# Patient Record
Sex: Male | Born: 1960 | Race: Black or African American | Hispanic: No | Marital: Single | Smoking: Former smoker
Health system: Southern US, Community
[De-identification: ages and names within clinical notes are randomized; demographics above are authoritative.]

## PROBLEM LIST (undated history)

## (undated) DIAGNOSIS — M719 Bursopathy, unspecified: Secondary | ICD-10-CM

## (undated) HISTORY — PX: CYST EXCISION: SHX5701

## (undated) HISTORY — PX: COLONOSCOPY: SHX174

---

## 2012-04-13 ENCOUNTER — Encounter (HOSPITAL_COMMUNITY): Payer: Self-pay | Admitting: *Deleted

## 2012-04-13 ENCOUNTER — Emergency Department (HOSPITAL_COMMUNITY)
Admission: EM | Admit: 2012-04-13 | Discharge: 2012-04-13 | Disposition: A | Discharge status: Home / Self Care | Payer: Self-pay | Attending: Emergency Medicine | Admitting: Emergency Medicine

## 2012-04-13 DIAGNOSIS — M778 Other enthesopathies, not elsewhere classified: Secondary | ICD-10-CM

## 2012-04-13 DIAGNOSIS — M2540 Effusion, unspecified joint: Secondary | ICD-10-CM | POA: Insufficient documentation

## 2012-04-13 DIAGNOSIS — Z8739 Personal history of other diseases of the musculoskeletal system and connective tissue: Secondary | ICD-10-CM | POA: Insufficient documentation

## 2012-04-13 DIAGNOSIS — M659 Unspecified synovitis and tenosynovitis, unspecified site: Secondary | ICD-10-CM | POA: Insufficient documentation

## 2012-04-13 DIAGNOSIS — Z791 Long term (current) use of non-steroidal anti-inflammatories (NSAID): Secondary | ICD-10-CM | POA: Insufficient documentation

## 2012-04-13 DIAGNOSIS — M1990 Unspecified osteoarthritis, unspecified site: Secondary | ICD-10-CM | POA: Insufficient documentation

## 2012-04-13 HISTORY — DX: Bursopathy, unspecified: M71.9

## 2012-04-13 MED ORDER — PROMETHAZINE HCL 12.5 MG PO TABS
12.5000 mg | ORAL_TABLET | Freq: Once | ORAL | Status: AC
  Administered 2012-04-13: 12.5 mg via ORAL
  Filled 2012-04-13: qty 1

## 2012-04-13 MED ORDER — KETOROLAC TROMETHAMINE 10 MG PO TABS
10.0000 mg | ORAL_TABLET | Freq: Once | ORAL | Status: AC
  Administered 2012-04-13: 10 mg via ORAL
  Filled 2012-04-13: qty 1

## 2012-04-13 MED ORDER — PREDNISONE 50 MG PO TABS
60.0000 mg | ORAL_TABLET | Freq: Once | ORAL | Status: AC
  Administered 2012-04-13: 60 mg via ORAL
  Filled 2012-04-13: qty 1

## 2012-04-13 MED ORDER — HYDROCODONE-ACETAMINOPHEN 5-325 MG PO TABS
2.0000 | ORAL_TABLET | Freq: Once | ORAL | Status: AC
  Administered 2012-04-13: 2 via ORAL
  Filled 2012-04-13: qty 2

## 2012-04-13 MED ORDER — PREDNISONE 10 MG PO TABS: ORAL_TABLET | ORAL | Status: DC

## 2012-04-13 MED ORDER — MELOXICAM 7.5 MG PO TABS: ORAL_TABLET | ORAL | Status: DC

## 2012-04-13 MED ORDER — HYDROCODONE-ACETAMINOPHEN 5-325 MG PO TABS: ORAL_TABLET | ORAL | Status: DC

## 2012-04-13 NOTE — ED Notes (Signed)
Pain rt wrist for 1 week, No recent injury,  Says he has flare ups of pain in different joints and say it is bursitis

## 2012-04-13 NOTE — ED Provider Notes (Signed)
History     CSN: [id number]  Arrival date & time 04/13/12  1435   First MD Initiated Contact with Patient 04/13/12 1522      Chief Complaint  Patient presents with  . Wrist Pain    (Consider location/radiation/quality/duration/timing/severity/associated sxs/prior treatment) Patient is a 52 y.o. male presenting with wrist pain. The history is provided by the patient.  Wrist Pain This is a new problem. The current episode started in the past 7 days. The problem occurs daily. The problem has been gradually worsening. Associated symptoms include arthralgias and joint swelling. Pertinent negatives include no abdominal pain, chest pain, coughing or neck pain. Exacerbated by: movement and palpation. He has tried nothing for the symptoms. The treatment provided no relief.    Past Medical History  Diagnosis Date  . Bursitis     History reviewed. No pertinent past surgical history.  Family History  Problem Relation Age of Onset  . Hypertension Mother   . Hypertension Father     History  Substance Use Topics  . Smoking status: Never Smoker   . Smokeless tobacco: Not on file  . Alcohol Use: Yes      Review of Systems  Constitutional: Negative for activity change.       All ROS Neg except as noted in HPI  HENT: Negative for nosebleeds and neck pain.   Eyes: Negative for photophobia and discharge.  Respiratory: Negative for cough, shortness of breath and wheezing.   Cardiovascular: Negative for chest pain and palpitations.  Gastrointestinal: Negative for abdominal pain and blood in stool.  Genitourinary: Negative for dysuria, frequency and hematuria.  Musculoskeletal: Positive for joint swelling and arthralgias. Negative for back pain.  Skin: Negative.   Neurological: Negative for dizziness, seizures and speech difficulty.  Psychiatric/Behavioral: Negative for hallucinations and confusion.    Allergies  Penicillins  Home Medications   Current Outpatient Rx  Name  Route   Sig  Dispense  Refill  . NAPROXEN SODIUM 220 MG PO TABS   Oral   Take 220 mg by mouth 2 (two) times daily with a meal.         . HYDROCODONE-ACETAMINOPHEN 5-325 MG PO TABS      1 or 2 po q4h prn pain   20 tablet   0   . MELOXICAM 7.5 MG PO TABS      1 po bid with food   12 tablet   0   . PREDNISONE 10 MG PO TABS      6,5,4,3,2,1 - take with food   21 tablet   0     BP 158/96  Pulse 89  Temp 97.9 F (36.6 C) (Oral)  Resp 20  Ht 6' (1.829 m)  Wt 215 lb (97.523 kg)  BMI 29.16 kg/m2  SpO2 100%  Physical Exam  Nursing note and vitals reviewed. Constitutional: He is oriented to person, place, and time. He appears well-developed and well-nourished.  Non-toxic appearance.  HENT:  Head: Normocephalic.  Right Ear: Tympanic membrane and external ear normal.  Left Ear: Tympanic membrane and external ear normal.  Eyes: EOM and lids are normal. Pupils are equal, round, and reactive to light.  Neck: Normal range of motion. Neck supple. Carotid bruit is not present.  Cardiovascular: Normal rate, regular rhythm, normal heart sounds, intact distal pulses and normal pulses.   Pulmonary/Chest: Breath sounds normal. No respiratory distress.  Abdominal: Soft. Bowel sounds are normal. There is no tenderness. There is no guarding.  Musculoskeletal: Normal range of  motion.       Right hand and wrist swollen. There is pain with flexion and a extension of the wrist and fingers. There is good range of motion of the right elbow and shoulder. No effusion noted. No hot joints appreciated. There degenerative changes noted of the hands.  Lymphadenopathy:       Head (right side): No submandibular adenopathy present.       Head (left side): No submandibular adenopathy present.    He has no cervical adenopathy.  Neurological: He is alert and oriented to person, place, and time. He has normal strength. No cranial nerve deficit or sensory deficit.  Skin: Skin is warm and dry.  Psychiatric: He has  a normal mood and affect. His speech is normal.    ED Course  Procedures (including critical care time)  Labs Reviewed - No data to display No results found.   1. Tendonitis of wrist, right   2. DJD (degenerative joint disease)       MDM  I have reviewed nursing notes, vital signs, and all appropriate lab and imaging results for this patient. Patient has a history of bursitis and arthritis. He works a job that involves a lot of of upper extremity movement in particular the wrist. His examination today is consistent with tendinitis and degenerative joint disease. Prescription for Mobic 7.5, prednisone taper, and Norco given to the patient. The patient is to see Dr. Romeo Apple for additional evaluation if not improving       Kathie Dike, [place] 04/13/12 1657

## 2012-04-14 NOTE — ED Provider Notes (Signed)
Medical screening examination/treatment/procedure(s) were performed by non-physician practitioner and as supervising physician I was immediately available for consultation/collaboration. Devoria Albe, MD, Armando Gang   Ward Givens, MD 04/14/12 [phone]

## 2012-09-20 ENCOUNTER — Encounter (HOSPITAL_COMMUNITY): Payer: Self-pay | Admitting: Emergency Medicine

## 2012-09-20 ENCOUNTER — Emergency Department (HOSPITAL_COMMUNITY)
Admission: EM | Admit: 2012-09-20 | Discharge: 2012-09-20 | Disposition: A | Discharge status: Home / Self Care | Payer: Self-pay | Attending: Emergency Medicine | Admitting: Emergency Medicine

## 2012-09-20 DIAGNOSIS — Z880 Allergy status to penicillin: Secondary | ICD-10-CM | POA: Insufficient documentation

## 2012-09-20 DIAGNOSIS — M5432 Sciatica, left side: Secondary | ICD-10-CM

## 2012-09-20 DIAGNOSIS — M5430 Sciatica, unspecified side: Secondary | ICD-10-CM | POA: Insufficient documentation

## 2012-09-20 DIAGNOSIS — Z8739 Personal history of other diseases of the musculoskeletal system and connective tissue: Secondary | ICD-10-CM | POA: Insufficient documentation

## 2012-09-20 MED ORDER — HYDROCODONE-ACETAMINOPHEN 5-325 MG PO TABS: 1.0000 | ORAL_TABLET | ORAL | Status: DC | PRN

## 2012-09-20 MED ORDER — CYCLOBENZAPRINE HCL 10 MG PO TABS: 10.0000 mg | ORAL_TABLET | Freq: Two times a day (BID) | ORAL | Status: DC | PRN

## 2012-09-20 NOTE — ED Notes (Signed)
Pt c/o left hip pain since Saturday. Denies injury. H/s bursitis-states it usually feels better with rest but pain is not going away this time. nad noted.

## 2012-09-20 NOTE — ED Notes (Signed)
Pt advised to have Bp evaluated.

## 2012-09-20 NOTE — ED Provider Notes (Signed)
Medical screening examination/treatment/procedure(s) were performed by non-physician practitioner and as supervising physician I was immediately available for consultation/collaboration.  Jesiah Yerby R. Jaequan Propes, MD 09/20/12 2324 

## 2012-09-20 NOTE — ED Notes (Signed)
Lt hip pain since Saturday, no injury, Says he has hx of bursitiis  And thinks that is causing the pain in his hip.

## 2012-09-20 NOTE — ED Provider Notes (Signed)
History     CSN: [id number]  Arrival date & time 09/20/12  1358   First MD Initiated Contact with Patient 09/20/12 1603      Chief Complaint  Patient presents with  . Hip Pain    (Consider location/radiation/quality/duration/timing/severity/associated sxs/prior treatment) Patient is a 52 y.o. male presenting with hip pain. The history is provided by the patient.  Hip Pain This is a new problem. The current episode started in the past 7 days. The problem occurs constantly. The problem has been gradually worsening. Pertinent negatives include no abdominal pain, chest pain, chills, fever, nausea, neck pain, rash, vomiting or weakness. The symptoms are aggravated by standing and walking. He has tried rest for the symptoms.   Bobby Carroll is a 52 y.o. male who presents to the ED with left hip pain that started 3 days ago. He has a history of bursitis in the left hip but usually rest and Aleve will make it better. This time he continues to have pain in the left buttock area even with rest and Aleve.   Past Medical History  Diagnosis Date  . Bursitis     Past Surgical History  Procedure Laterality Date  . Cyst excision      Family History  Problem Relation Age of Onset  . Hypertension Mother   . Hypertension Father     History  Substance Use Topics  . Smoking status: Never Smoker   . Smokeless tobacco: Not on file  . Alcohol Use: Yes     Comment: occasional      Review of Systems  Constitutional: Negative for fever and chills.  HENT: Negative for neck pain.   Respiratory: Negative for shortness of breath.   Cardiovascular: Negative for chest pain.  Gastrointestinal: Negative for nausea, vomiting and abdominal pain.  Musculoskeletal: Positive for back pain. Gait problem: due to pain.  Skin: Negative for rash.  Neurological: Negative for weakness.  Psychiatric/Behavioral: The patient is not nervous/anxious.     Allergies  Penicillins  Home Medications   Current  Outpatient Rx  Name  Route  Sig  Dispense  Refill  . cyclobenzaprine (FLEXERIL) 10 MG tablet   Oral   Take 1 tablet (10 mg total) by mouth 2 (two) times daily as needed for muscle spasms.   20 tablet   0   . HYDROcodone-acetaminophen (NORCO/VICODIN) 5-325 MG per tablet   Oral   Take 1 tablet by mouth every 4 (four) hours as needed.   15 tablet   0     BP 152/95  Pulse 86  Temp(Src) 98.6 F (37 C) (Oral)  Resp 17  SpO2 100%  Physical Exam  Nursing note and vitals reviewed. Constitutional: He is oriented to person, place, and time. He appears well-developed and well-nourished. No distress.  Slightly elevated blood pressure  HENT:  Head: Normocephalic and atraumatic.  Eyes: Conjunctivae and EOM are normal.  Neck: Normal range of motion. Neck supple.  Cardiovascular: Normal rate and regular rhythm.   Pulmonary/Chest: Effort normal and breath sounds normal.  Abdominal: Soft. Bowel sounds are normal. There is no tenderness.  Musculoskeletal:       Lumbar back: He exhibits decreased range of motion and tenderness. He exhibits no bony tenderness, no laceration and normal pulse.       Back:  Tender with palpation over left sciatic nerve. Pedal pulses present and equal bilateral, adequate circulation, good touch sensation.   Neurological: He is alert and oriented to person, place, and time. He  has normal strength and normal reflexes. No cranial nerve deficit or sensory deficit.  Patient walking with limp due to pain in left hip. Straight leg raises without difficulty  Skin: Skin is warm and dry.  Psychiatric: He has a normal mood and affect. His behavior is normal. Judgment and thought content normal.    ED Course  Procedures (including critical care time)  MDM  52 y.o. male with pain with palpation of left sciatica nerve. Will treat pain and have him follow up with ortho. He will continue his Aleve. Patient stable for discharge home without any immediate complications.  Discussed BP elevation and need for follow up.  Discussed with the patient clinical findings and plan of care and all questioned fully answered. He will return if problems arise.    Medication List    STOP taking these medications       meloxicam 7.5 MG tablet  Commonly known as:  MOBIC     naproxen sodium 220 MG tablet  Commonly known as:  ANAPROX     predniSONE 10 MG tablet  Commonly known as:  DELTASONE      TAKE these medications       cyclobenzaprine 10 MG tablet  Commonly known as:  FLEXERIL  Take 1 tablet (10 mg total) by mouth 2 (two) times daily as needed for muscle spasms.     HYDROcodone-acetaminophen 5-325 MG per tablet  Commonly known as:  NORCO/VICODIN  Take 1 tablet by mouth every 4 (four) hours as needed.             [place], [place] 09/20/12 [phone]

## 2012-12-15 ENCOUNTER — Emergency Department (HOSPITAL_COMMUNITY)
Admission: EM | Admit: 2012-12-15 | Discharge: 2012-12-15 | Disposition: A | Discharge status: Home / Self Care | Payer: BC Managed Care – PPO | Attending: Emergency Medicine | Admitting: Emergency Medicine

## 2012-12-15 ENCOUNTER — Encounter (HOSPITAL_COMMUNITY): Payer: Self-pay | Admitting: *Deleted

## 2012-12-15 ENCOUNTER — Emergency Department (HOSPITAL_COMMUNITY): Payer: BC Managed Care – PPO

## 2012-12-15 DIAGNOSIS — Y939 Activity, unspecified: Secondary | ICD-10-CM | POA: Insufficient documentation

## 2012-12-15 DIAGNOSIS — M5030 Other cervical disc degeneration, unspecified cervical region: Secondary | ICD-10-CM

## 2012-12-15 DIAGNOSIS — R296 Repeated falls: Secondary | ICD-10-CM | POA: Insufficient documentation

## 2012-12-15 DIAGNOSIS — M4180 Other forms of scoliosis, site unspecified: Secondary | ICD-10-CM | POA: Insufficient documentation

## 2012-12-15 DIAGNOSIS — S20229A Contusion of unspecified back wall of thorax, initial encounter: Secondary | ICD-10-CM | POA: Insufficient documentation

## 2012-12-15 DIAGNOSIS — Z880 Allergy status to penicillin: Secondary | ICD-10-CM | POA: Insufficient documentation

## 2012-12-15 DIAGNOSIS — T148XXA Other injury of unspecified body region, initial encounter: Secondary | ICD-10-CM | POA: Insufficient documentation

## 2012-12-15 DIAGNOSIS — M419 Scoliosis, unspecified: Secondary | ICD-10-CM

## 2012-12-15 DIAGNOSIS — Y929 Unspecified place or not applicable: Secondary | ICD-10-CM | POA: Insufficient documentation

## 2012-12-15 DIAGNOSIS — M5090 Cervical disc disorder, unspecified, unspecified cervical region: Secondary | ICD-10-CM | POA: Insufficient documentation

## 2012-12-15 MED ORDER — DEXAMETHASONE 6 MG PO TABS: ORAL_TABLET | ORAL | Status: DC

## 2012-12-15 MED ORDER — METHOCARBAMOL 500 MG PO TABS: 500.0000 mg | ORAL_TABLET | Freq: Three times a day (TID) | ORAL | Status: DC

## 2012-12-15 MED ORDER — DICLOFENAC SODIUM 75 MG PO TBEC: 75.0000 mg | DELAYED_RELEASE_TABLET | Freq: Two times a day (BID) | ORAL | Status: DC

## 2012-12-15 NOTE — ED Provider Notes (Signed)
Medical screening examination/treatment/procedure(s) were performed by non-physician practitioner and as supervising physician I was immediately available for consultation/collaboration.  Teaghan Dereke Neumann, MD 12/15/12 1722 

## 2012-12-15 NOTE — ED Notes (Signed)
States he fell in the shower 2 days ago and hit upper back , states pain is radiating into posterior neck, denies any head injury

## 2012-12-15 NOTE — ED Provider Notes (Signed)
CSN: [id number]     Arrival date & time 12/15/12  1518 History   First MD Initiated Contact with Patient 12/15/12 1610     Chief Complaint  Patient presents with  . Back Pain   (Consider location/radiation/quality/duration/timing/severity/associated sxs/prior Treatment) Patient is a 52 y.o. male presenting with back pain. The history is provided by the patient.  Back Pain Location:  Lumbar spine (cervical region) Quality:  Aching, stiffness and cramping Stiffness is present:  All day Pain severity:  Moderate Pain is:  Same all the time Onset quality:  Gradual Duration:  2 days Timing:  Intermittent Progression:  Worsening Chronicity:  New Context: falling   Relieved by:  Nothing Worsened by:  Movement Associated symptoms: no abdominal pain, no bladder incontinence, no bowel incontinence, no chest pain, no dysuria and no perianal numbness     Past Medical History  Diagnosis Date  . Bursitis    Past Surgical History  Procedure Laterality Date  . Cyst excision     Family History  Problem Relation Age of Onset  . Hypertension Mother   . Hypertension Father    History  Substance Use Topics  . Smoking status: Never Smoker   . Smokeless tobacco: Not on file  . Alcohol Use: Yes     Comment: occasional    Review of Systems  Constitutional: Negative for activity change.       All ROS Neg except as noted in HPI  HENT: Negative for nosebleeds and neck pain.   Eyes: Negative for photophobia and discharge.  Respiratory: Negative for cough, shortness of breath and wheezing.   Cardiovascular: Negative for chest pain and palpitations.  Gastrointestinal: Negative for abdominal pain, blood in stool and bowel incontinence.  Genitourinary: Negative for bladder incontinence, dysuria, frequency and hematuria.  Musculoskeletal: Positive for back pain and arthralgias.  Skin: Negative.   Neurological: Negative for dizziness, seizures and speech difficulty.  Psychiatric/Behavioral:  Negative for hallucinations and confusion.    Allergies  Penicillins  Home Medications  No current outpatient prescriptions on file. BP 142/98  Pulse 80  Temp(Src) 98.6 F (37 C) (Oral)  Resp 18  Ht 6' (1.829 m)  Wt 210 lb (95.255 kg)  BMI 28.47 kg/m2  SpO2 100% Physical Exam  Nursing note and vitals reviewed. Constitutional: He is oriented to person, place, and time. He appears well-developed and well-nourished.  Non-toxic appearance.  HENT:  Head: Normocephalic.  Right Ear: Tympanic membrane and external ear normal.  Left Ear: Tympanic membrane and external ear normal.  Eyes: EOM and lids are normal. Pupils are equal, round, and reactive to light.  Neck: Normal range of motion. Neck supple. Carotid bruit is not present.  Cardiovascular: Normal rate, regular rhythm, normal heart sounds, intact distal pulses and normal pulses.   Pulmonary/Chest: Breath sounds normal. No respiratory distress.  Abdominal: Soft. Bowel sounds are normal. There is no tenderness. There is no guarding.  Musculoskeletal: Normal range of motion.  There is good ROM of the cervical spine with mild crepitus. Soreness of the trapezius muscles noted. There is pain to palpation of the lower lumbar area. No hematoma noted. No hot areas present. There is pain with changes of position of the lumbar spine area.  Lymphadenopathy:       Head (right side): No submandibular adenopathy present.       Head (left side): No submandibular adenopathy present.    He has no cervical adenopathy.  Neurological: He is alert and oriented to person, place, and time.  He has normal strength. No cranial nerve deficit or sensory deficit.  Skin: Skin is warm and dry.  Psychiatric: He has a normal mood and affect. His speech is normal.    ED Course  Procedures (including critical care time) Labs Review Labs Reviewed - No data to display Imaging Review Dg Cervical Spine Complete  12/15/2012   *RADIOLOGY REPORT*  Clinical Data:  Post fall in the shower now with neck and low back pain  CERVICAL SPINE - COMPLETE 4+ VIEW  Comparison: None.  Findings:  The C1 to the superior endplate of C7 is imaged on the provided lateral radiograph.  There is obscuration of the cervical thoracic junction secondary to overlying osseous and soft tissue structures. Examination is further degraded secondary to the presence of a left sided earring.  Normal alignment of the cervical spine.  No anterolisthesis or retrolisthesis.  The bilateral facets are normally aligned.  The dens is normally positioned between the lateral masses of C1.  Cervical vertebral body heights are preserved.  Prevertebral soft tissues are normal.  There is mild multilevel cervical spine DDD, likely worse at C4-C5 with disc space height loss, end plate irregularity and sclerosis.  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  Regional soft tissues are otherwise normal.  Limited visualization of lung apices are normal.  IMPRESSION: 1.  No definite acute findings.  Note, there is obscuration of the cervical thoracic junction secondary to overlying osseous and soft tissue structures. 2.  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   Original Report Authenticated By: Tacey Ruiz, MD   Dg Lumbar Spine Complete  12/15/2012   *RADIOLOGY REPORT*  Clinical Data: Post fall in shower, now with low back pain  LUMBAR SPINE - COMPLETE 4+ VIEW  Comparison: None.  Findings:  There is a mild scoliotic curvature of the lumbar spine, convex to the left, possibly positional.  No anterolisthesis or retrolisthesis.  No definite pars defects.  Lumbar vertebral body heights are preserved.  Intervertebral disc spaces are preserved.  Limited visualization of the bilateral SI joints is normal. Multiple phleboliths overlie the lower pelvis.  Regional bowel gas pattern is normal.  IMPRESSION:  No acute findings.   Original Report Authenticated By: Tacey Ruiz, MD    MDM  No diagnosis found. *I have  reviewed nursing notes, vital signs, and all appropriate lab and imaging results for this patient. Xray of the cervical spine is negative for fx, but reveal DDD at C4-C5 area. Xray of the lumbar area is negative for fx, but reveal mild scoliotic changes at the lumbar area. No gross neuro deficit on exam today. Plan - orthopedic evaluation with Dr Luiz Blare. Rx for robaxin, diclofenac, and decadron given. Pt to return if any changes or problem.   Kathie Dike, PA-C 12/15/12 1719

## 2014-07-23 IMAGING — CR DG LUMBAR SPINE COMPLETE 4+V
5 series · 5 of 5 positions shown · non-contrast
Comparison: None.

CLINICAL DATA: Post fall in shower, now with low back pain

LUMBAR SPINE - COMPLETE 4+ VIEW

[view not recorded (1 of 5)]
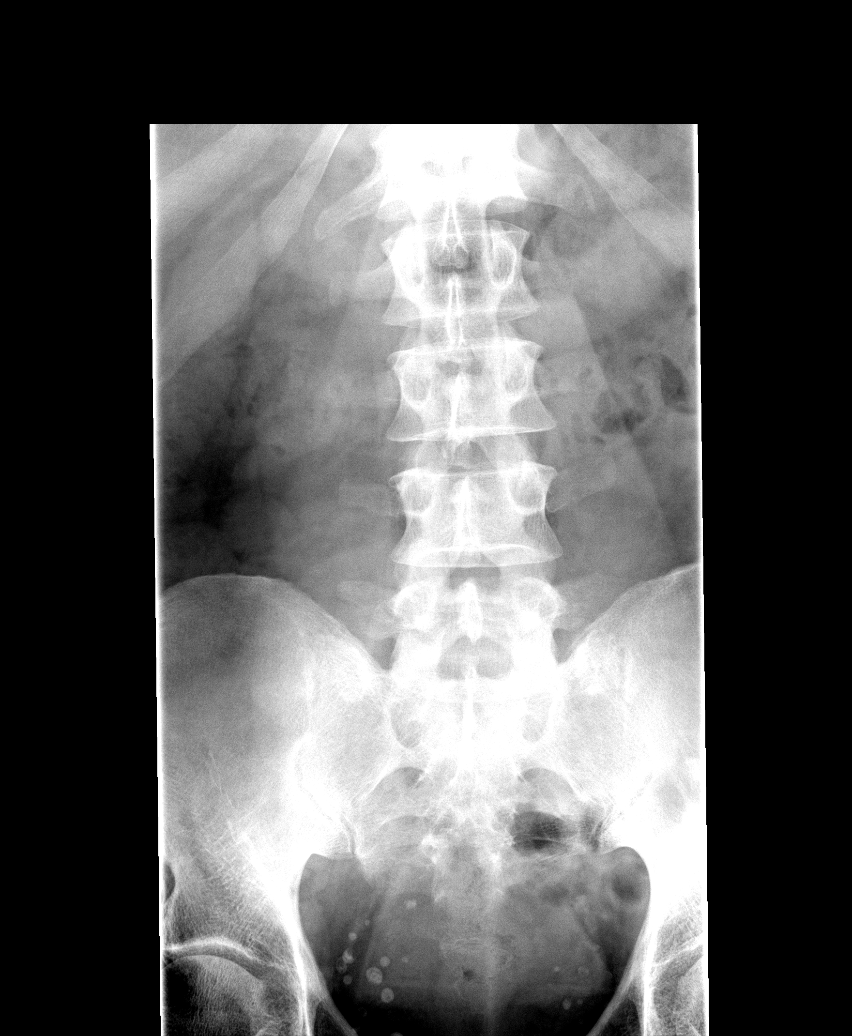

[view not recorded (2 of 5)]
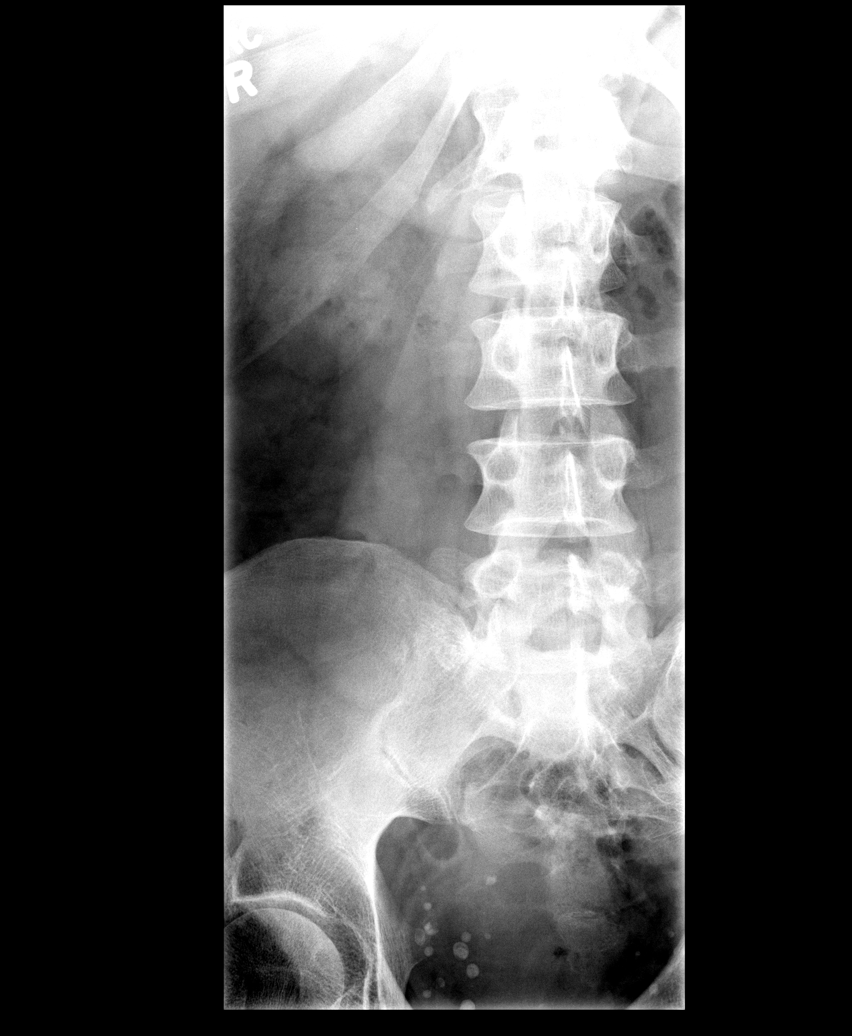

[view not recorded (3 of 5)]
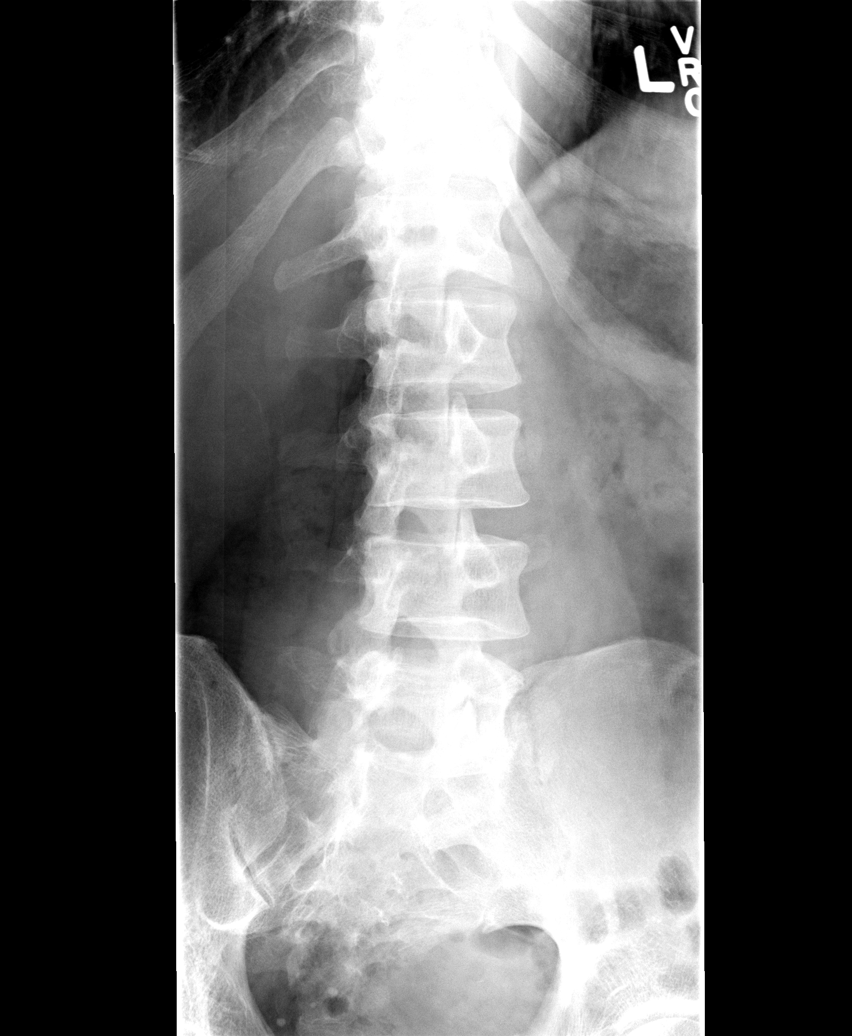

[view not recorded (4 of 5)]
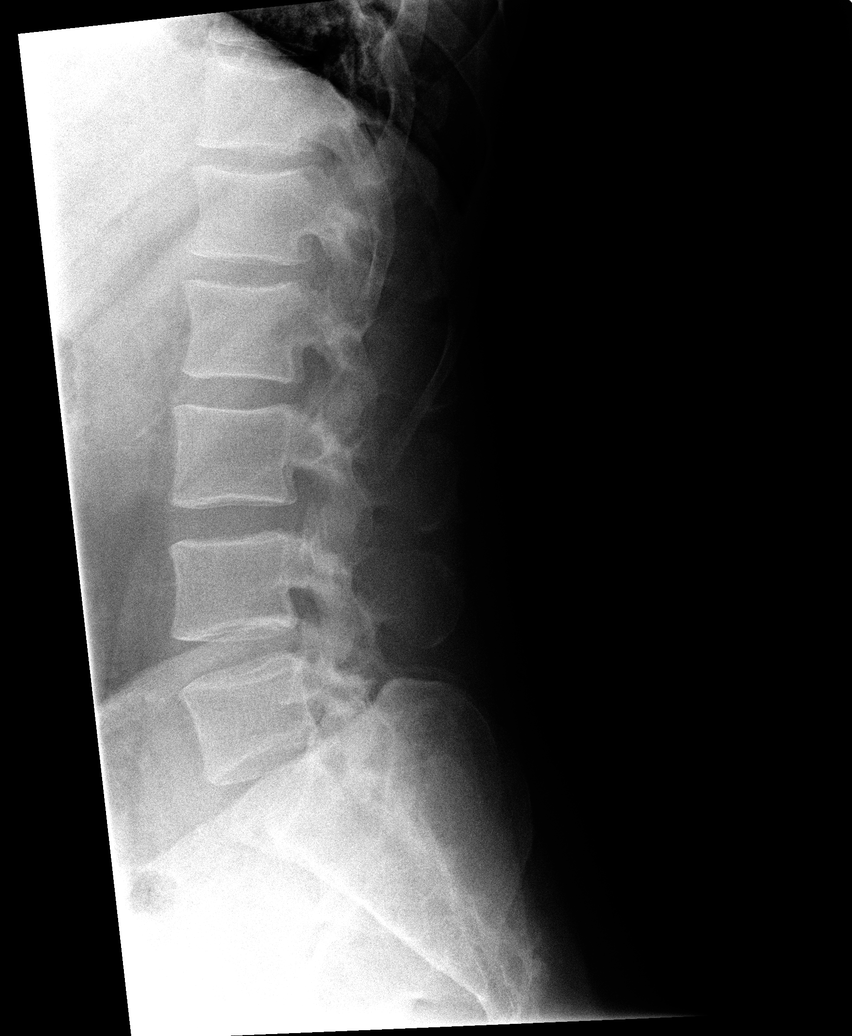

[view not recorded (5 of 5)]
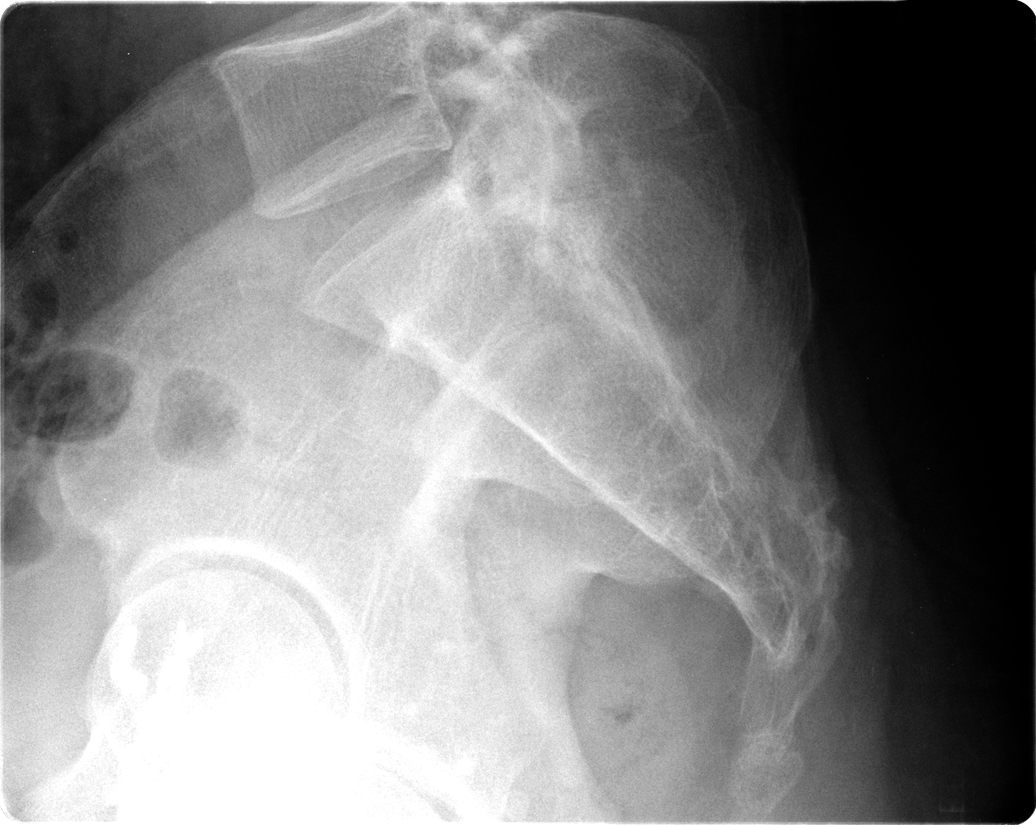

[5 of 5 positions shown; findings below may reference images not displayed]

FINDINGS: There is a mild scoliotic curvature of the lumbar spine, convex to
the left, possibly positional.  No anterolisthesis or
retrolisthesis.  No definite pars defects.

Lumbar vertebral body heights are preserved.

Intervertebral disc spaces are preserved.

Limited visualization of the bilateral SI joints is normal.
Multiple phleboliths overlie the lower pelvis.  Regional bowel gas
pattern is normal.
IMPRESSION: No acute findings.

## 2014-09-17 ENCOUNTER — Encounter (HOSPITAL_COMMUNITY): Payer: Self-pay | Admitting: Emergency Medicine

## 2014-09-17 ENCOUNTER — Emergency Department (HOSPITAL_COMMUNITY)
Admission: EM | Admit: 2014-09-17 | Discharge: 2014-09-17 | Disposition: A | Discharge status: Home / Self Care | Payer: BLUE CROSS/BLUE SHIELD | Attending: Emergency Medicine | Admitting: Emergency Medicine

## 2014-09-17 DIAGNOSIS — Z880 Allergy status to penicillin: Secondary | ICD-10-CM | POA: Insufficient documentation

## 2014-09-17 DIAGNOSIS — Z79899 Other long term (current) drug therapy: Secondary | ICD-10-CM | POA: Insufficient documentation

## 2014-09-17 DIAGNOSIS — Z791 Long term (current) use of non-steroidal anti-inflammatories (NSAID): Secondary | ICD-10-CM | POA: Diagnosis not present

## 2014-09-17 DIAGNOSIS — L509 Urticaria, unspecified: Secondary | ICD-10-CM | POA: Diagnosis not present

## 2014-09-17 DIAGNOSIS — Z720 Tobacco use: Secondary | ICD-10-CM | POA: Insufficient documentation

## 2014-09-17 DIAGNOSIS — R21 Rash and other nonspecific skin eruption: Secondary | ICD-10-CM | POA: Diagnosis present

## 2014-09-17 DIAGNOSIS — L508 Other urticaria: Secondary | ICD-10-CM

## 2014-09-17 DIAGNOSIS — Z8739 Personal history of other diseases of the musculoskeletal system and connective tissue: Secondary | ICD-10-CM | POA: Insufficient documentation

## 2014-09-17 MED ORDER — FAMOTIDINE 20 MG PO TABS
20.0000 mg | ORAL_TABLET | Freq: Once | ORAL | Status: AC
  Administered 2014-09-17: 20 mg via ORAL
  Filled 2014-09-17: qty 1

## 2014-09-17 MED ORDER — DIPHENHYDRAMINE HCL 25 MG PO CAPS
25.0000 mg | ORAL_CAPSULE | Freq: Once | ORAL | Status: AC
  Administered 2014-09-17: 25 mg via ORAL
  Filled 2014-09-17: qty 1

## 2014-09-17 MED ORDER — PREDNISONE 10 MG PO TABS: ORAL_TABLET | ORAL | Status: DC

## 2014-09-17 MED ORDER — METHYLPREDNISOLONE ACETATE 80 MG/ML IJ SUSP
120.0000 mg | Freq: Once | INTRAMUSCULAR | Status: AC
  Administered 2014-09-17: 120 mg via INTRAMUSCULAR
  Filled 2014-09-17: qty 2

## 2014-09-17 NOTE — ED Provider Notes (Signed)
CSN: [id number]     Arrival date & time 09/17/14  1425 History  This chart was scribed for Bobby Aus, PA-C, working with Layla Maw Ward, DO by Octavia Heir, ED Scribe. This patient was seen in room APFT23/APFT23 and the patient's care was started at 3:41 PM.    Chief Complaint  Patient presents with  . Allergic Reaction     HPI HPI Comments: Elwood Bazinet is a 54 y.o. male who presents to the Emergency Department complaining of an allergic reaction onset 3 hours ago. Pt was at work when he suddenly started breaking out hives all over his body. Pt has taken OTC Benadryl to alleviate the symptoms with significant relief. He also notes that he takes aleve in the morning due to recent diagnosis of bursitis and for tooth pain.  Pt denies any diet change to trigger reaction. He notes that reaction could be stress related. Pt denies trouble swallowing, difficulty breathing, wheezing, chest tightness, changes in diet, and new soaps. Pt is not on blood pressure medication.  Past Medical History  Diagnosis Date  . Bursitis    Past Surgical History  Procedure Laterality Date  . Cyst excision    . Colonoscopy     Family History  Problem Relation Age of Onset  . Hypertension Mother   . Hypertension Father    History  Substance Use Topics  . Smoking status: Current Some Day Smoker  . Smokeless tobacco: Not on file  . Alcohol Use: Yes     Comment: occasional    Review of Systems  HENT: Negative for trouble swallowing.   Skin: Positive for rash.  All other systems reviewed and are negative.     Allergies  Penicillins  Home Medications   Prior to Admission medications   Medication Sig Start Date End Date Taking? Authorizing Provider  dexamethasone (DECADRON) 6 MG tablet 1 po bid with food 12/15/12   Ivery Quale, PA-C  diclofenac (VOLTAREN) 75 MG EC tablet Take 1 tablet (75 mg total) by mouth 2 (two) times daily. 12/15/12   Ivery Quale, PA-C  methocarbamol (ROBAXIN) 500 MG  tablet Take 1 tablet (500 mg total) by mouth 3 (three) times daily. 12/15/12   Ivery Quale, PA-C   Triage vitals: BP 139/101 mmHg  Pulse 94  Temp(Src) 98.7 F (37.1 C) (Oral)  Resp 18  Ht 6' (1.829 m)  Wt 200 lb (90.719 kg)  BMI 27.12 kg/m2  SpO2 99% Physical Exam  Constitutional: He is oriented to person, place, and time. He appears well-developed and well-nourished. No distress.  HENT:  Head: Normocephalic and atraumatic.  Mouth/Throat: Uvula is midline, oropharynx is clear and moist and mucous membranes are normal. No oral lesions. No uvula swelling. No oropharyngeal exudate.  Airway patent,  No angioedema  Eyes: Conjunctivae and EOM are normal. Pupils are equal, round, and reactive to light.  Neck: Normal range of motion. Neck supple.  Cardiovascular: Normal rate, regular rhythm, normal heart sounds and intact distal pulses.   Pulmonary/Chest: Effort normal and breath sounds normal. No stridor. No respiratory distress. He has no wheezes.  Musculoskeletal: Normal range of motion. He exhibits no edema or tenderness.  Neurological: He is alert and oriented to person, place, and time. No cranial nerve deficit. He exhibits normal muscle tone. Coordination normal.  Skin: Skin is warm.  Diffuse urticarial lesions to the trunk, neck, left wrist, and face No edema, vesicles or erythema  Psychiatric: He has a normal mood and affect. His behavior is normal.  Nursing note and vitals reviewed.   ED Course  Procedures  DIAGNOSTIC STUDIES: Oxygen Saturation is 99% on RA, normal by my interpretation.  COORDINATION OF CARE:  3:47 PM Discussed treatment plan which includes prednisone with pt at bedside and pt agreed to plan.   Labs Review Labs Reviewed - No data to display  Imaging Review No results found.   EKG Interpretation None      MDM   Final diagnoses:  Urticaria, acute    Pt is well appearing, no angioedema, airway patent.  Few scattered hives present.  Given IM  depo-medrol, po pepcid and benadryl.  Observed in the dept and sx's improving.  Itching improved.  No edema, feels ready for d/c.  Advised to d/c the aleve.  Agrees to close PMD f/u and to return here for any worsening sx's  I personally performed the services described in this documentation, which was scribed in my presence. The recorded information has been reviewed and is accurate.   Bobby Aus, PA-C 09/19/14 1759  Kristen N Ward, DO 09/19/14 2347

## 2014-09-17 NOTE — Discharge Instructions (Signed)
Hives  Hives are itchy, red, puffy (swollen) areas of the skin. Hives can change in size and location on your body. Hives can come and go for hours, days, or weeks. Hives do not spread from person to person (noncontagious). Scratching, exercise, and stress can make your hives worse.  HOME CARE  · Avoid things that cause your hives (triggers).  · Take antihistamine medicines as told by your doctor. Do not drive while taking an antihistamine.  · Take any other medicines for itching as told by your doctor.  · Wear loose-fitting clothing.  · Keep all doctor visits as told.  GET HELP RIGHT AWAY IF:   · You have a fever.  · Your tongue or lips are puffy.  · You have trouble breathing or swallowing.  · You feel tightness in the throat or chest.  · You have belly (abdominal) pain.  · You have lasting or severe itching that is not helped by medicine.  · You have painful or puffy joints.  These problems may be the first sign of a life-threatening allergic reaction. Call your local emergency services (911 in [place]  MAKE SURE YOU:   · Understand these instructions.  · Will watch your condition.  · Will get help right away if you are not doing well or get worse.  Document Released: 12/31/2007 Document Revised: 09/22/2011 Document Reviewed: 06/16/2011  [employer]® Patient Information ©2015 [employer]. This information is not intended to replace advice given to you by your health care provider. Make sure you discuss any questions you have with your health care provider.

## 2014-09-17 NOTE — ED Notes (Signed)
Patient states he was at work and broke out in hives at approximately 1245. States he took benadryl and has noticed it getting better. Hives noted to face, neck, arms, hands, and back at triage.

## 2015-06-20 ENCOUNTER — Emergency Department (HOSPITAL_COMMUNITY)
Admission: EM | Admit: 2015-06-20 | Discharge: 2015-06-20 | Disposition: A | Discharge status: Home / Self Care | Payer: BLUE CROSS/BLUE SHIELD | Attending: Emergency Medicine | Admitting: Emergency Medicine

## 2015-06-20 ENCOUNTER — Encounter (HOSPITAL_COMMUNITY): Payer: Self-pay | Admitting: Emergency Medicine

## 2015-06-20 DIAGNOSIS — M62838 Other muscle spasm: Secondary | ICD-10-CM | POA: Diagnosis not present

## 2015-06-20 DIAGNOSIS — F17200 Nicotine dependence, unspecified, uncomplicated: Secondary | ICD-10-CM | POA: Insufficient documentation

## 2015-06-20 DIAGNOSIS — M542 Cervicalgia: Secondary | ICD-10-CM | POA: Diagnosis present

## 2015-06-20 MED ORDER — METHOCARBAMOL 500 MG PO TABS
1000.0000 mg | ORAL_TABLET | Freq: Once | ORAL | Status: AC
  Administered 2015-06-20: 1000 mg via ORAL
  Filled 2015-06-20: qty 2

## 2015-06-20 MED ORDER — METHOCARBAMOL 500 MG PO TABS: 1000.0000 mg | ORAL_TABLET | Freq: Four times a day (QID) | ORAL | Status: AC

## 2015-06-20 MED ORDER — NAPROXEN 500 MG PO TABS: 500.0000 mg | ORAL_TABLET | Freq: Two times a day (BID) | ORAL | Status: DC

## 2015-06-20 MED ORDER — NAPROXEN 250 MG PO TABS
500.0000 mg | ORAL_TABLET | Freq: Once | ORAL | Status: AC
  Administered 2015-06-20: 500 mg via ORAL
  Filled 2015-06-20: qty 2

## 2015-06-20 NOTE — Discharge Instructions (Signed)
Heat Therapy  Heat therapy can help ease sore, stiff, injured, and tight muscles and joints. Heat relaxes your muscles, which may help ease your pain.   RISKS AND COMPLICATIONS  If you have any of the following conditions, do not use heat therapy unless your health care provider has approved:   Poor circulation.   Healing wounds or scarred skin in the area being treated.   Diabetes, heart disease, or high blood pressure.   Not being able to feel (numbness) the area being treated.   Unusual swelling of the area being treated.   Active infections.   Blood clots.   Cancer.   Inability to communicate pain. This may include young children and people who have problems with their brain function (dementia).   Pregnancy.  Heat therapy should only be used on old, pre-existing, or long-lasting (chronic) injuries. Do not use heat therapy on new injuries unless directed by your health care provider.  HOW TO USE HEAT THERAPY  There are several different kinds of heat therapy, including:   Moist heat pack.   Warm water bath.   Hot water bottle.   Electric heating pad.   Heated gel pack.   Heated wrap.   Electric heating pad.  Use the heat therapy method suggested by your health care provider. Follow your health care provider's instructions on when and how to use heat therapy.  GENERAL HEAT THERAPY RECOMMENDATIONS   Do not sleep while using heat therapy. Only use heat therapy while you are awake.   Your skin may turn pink while using heat therapy. Do not use heat therapy if your skin turns red.   Do not use heat therapy if you have new pain.   High heat or long exposure to heat can cause burns. Be careful when using heat therapy to avoid burning your skin.   Do not use heat therapy on areas of your skin that are already irritated, such as with a rash or sunburn.  SEEK MEDICAL CARE IF:   You have blisters, redness, swelling, or numbness.   You have new pain.   Your pain is worse.  MAKE SURE  YOU:   Understand these instructions.   Will watch your condition.   Will get help right away if you are not doing well or get worse.     This information is not intended to replace advice given to you by your health care provider. Make sure you discuss any questions you have with your health care provider.     Document Released: 06/15/2011 Document Revised: 04/13/2014 Document Reviewed: 05/16/2013  [employer]vier Interactive Patient Education 2016 [employer]

## 2015-06-20 NOTE — ED Notes (Addendum)
Patient complaining of right sided neck pain x 4 days. Patient admits to drinking "one beer" tonight prior to arrival to ER. States "I wasn't going to come up here but I had to bring my fiance here so I thought I would get it checked out."

## 2015-06-21 NOTE — ED Provider Notes (Signed)
CSN: [id number]     Arrival date & time 06/20/15  2114 History   First MD Initiated Contact with Patient 06/20/15 2132     Chief Complaint  Patient presents with  . Neck Pain     (Consider location/radiation/quality/duration/timing/severity/associated sxs/prior Treatment) The history is provided by the patient.   Bobby Carroll is a 55 y.o. male presenting with right sided neck pain and muscle spasm which he woke with 4 days ago.  He has increased pain with attempts to turn his head to the right, some soreness with leftward rotation as well, but has better movement in this direction.  He denies numbness or weakness in his arms or hands, and has had no injury or prior problems with neck pain.  He describes frequent heavy lifting with his job and is right handed.  He has tried no medicines or treatment for this problem.      Past Medical History  Diagnosis Date  . Bursitis    Past Surgical History  Procedure Laterality Date  . Cyst excision    . Colonoscopy     Family History  Problem Relation Age of Onset  . Hypertension Mother   . Hypertension Father    Social History  Substance Use Topics  . Smoking status: Current Some Day Smoker  . Smokeless tobacco: None  . Alcohol Use: Yes     Comment: occasional    Review of Systems  Constitutional: Negative for fever.  HENT: Negative.   Musculoskeletal: Positive for neck pain.  Skin: Negative.   Neurological: Negative for weakness and numbness.      Allergies  Penicillins  Home Medications   Prior to Admission medications   Medication Sig Start Date End Date Taking? Authorizing Provider  methocarbamol (ROBAXIN) 500 MG tablet Take 2 tablets (1,000 mg total) by mouth 4 (four) times daily. 06/20/15 06/30/15  Burgess Amor, PA-C  naproxen (NAPROSYN) 500 MG tablet Take 1 tablet (500 mg total) by mouth 2 (two) times daily. 06/20/15   Burgess Amor, PA-C  naproxen sodium (ANAPROX) 220 MG tablet Take 660 mg by mouth 2 (two) times daily  as needed (pain).     Historical Provider, MD  predniSONE (DELTASONE) 10 MG tablet Take 6 tablets day one, 5 tablets day two, 4 tablets day three, 3 tablets day four, 2 tablets day five, then 1 tablet day six Patient not taking: Reported on 06/20/2015 09/17/14   Tammy Triplett, PA-C   BP 155/118 mmHg  Pulse 79  Temp(Src) 98 F (36.7 C) (Oral)  Resp 18  Ht 6' (1.829 m)  Wt 98.431 kg  BMI 29.42 kg/m2  SpO2 100% Physical Exam  Constitutional: He appears well-developed and well-nourished.  HENT:  Head: Normocephalic and atraumatic. Head is without contusion.  Mouth/Throat: Uvula is midline and oropharynx is clear and moist. No trismus in the jaw.  Eyes: Conjunctivae are normal.  Neck: Neck supple. Tracheal tenderness and muscular tenderness present. No spinous process tenderness present. No rigidity. Decreased range of motion present. No edema present.    Cardiovascular: Normal rate and intact distal pulses.   Pedal pulses normal.  Pulmonary/Chest: Effort normal.  Abdominal: Soft. Bowel sounds are normal. He exhibits no distension and no mass.  Musculoskeletal: He exhibits no edema.       Cervical back: He exhibits spasm.       Thoracic back: Normal.       Lumbar back: Normal.  Neurological: He is alert. He has normal strength. He displays no atrophy and  no tremor. No sensory deficit. Gait normal.  Reflex Scores:      Bicep reflexes are 2+ on the right side and 2+ on the left side. No strength deficit noted in wrist and elbow flexor and extensor muscle groups.  Equal grip strength  Skin: Skin is warm and dry.  Psychiatric: He has a normal mood and affect.  Nursing note and vitals reviewed.   ED Course  Procedures (including critical care time) Labs Review Labs Reviewed - No data to display  Imaging Review No results found. I have personally reviewed and evaluated these images and lab results as part of my medical decision-making.   EKG Interpretation None      MDM    Final diagnoses:  Spasm of cervical paraspinous muscle    Robaxin, naproxen. Heat tx followed by ROM stretching which was discussed and demonstrated.  Prn f/u if sx do not improved, referral given.  The patient appears reasonably screened and/or stabilized for discharge and I doubt any other medical condition or other [facility] requiring further screening, evaluation, or treatment in the ED at this time prior to discharge.     Burgess AmorJulie Jeramiah Mccaughey, PA-C 06/22/15 0050  Raeford RazorStephen Kohut, MD 06/22/15 [phone]

## 2016-09-16 ENCOUNTER — Encounter (HOSPITAL_COMMUNITY): Payer: Self-pay | Admitting: Emergency Medicine

## 2016-09-16 ENCOUNTER — Emergency Department (HOSPITAL_COMMUNITY)
Admission: EM | Admit: 2016-09-16 | Discharge: 2016-09-16 | Disposition: A | Discharge status: Home / Self Care | Payer: BLUE CROSS/BLUE SHIELD | Attending: Emergency Medicine | Admitting: Emergency Medicine

## 2016-09-16 DIAGNOSIS — Z79899 Other long term (current) drug therapy: Secondary | ICD-10-CM | POA: Insufficient documentation

## 2016-09-16 DIAGNOSIS — M25532 Pain in left wrist: Secondary | ICD-10-CM | POA: Insufficient documentation

## 2016-09-16 DIAGNOSIS — Z87891 Personal history of nicotine dependence: Secondary | ICD-10-CM | POA: Insufficient documentation

## 2016-09-16 MED ORDER — TRAMADOL HCL 50 MG PO TABS: 50.0000 mg | ORAL_TABLET | Freq: Four times a day (QID) | ORAL | 0 refills | Status: AC | PRN

## 2016-09-16 MED ORDER — IBUPROFEN 800 MG PO TABS: 800.0000 mg | ORAL_TABLET | Freq: Three times a day (TID) | ORAL | 0 refills | Status: AC

## 2016-09-16 MED ORDER — PREDNISONE 20 MG PO TABS: 60.0000 mg | ORAL_TABLET | Freq: Every day | ORAL | 0 refills | Status: AC

## 2016-09-16 MED ORDER — IBUPROFEN 800 MG PO TABS
800.0000 mg | ORAL_TABLET | Freq: Once | ORAL | Status: AC
  Administered 2016-09-16: 800 mg via ORAL
  Filled 2016-09-16: qty 1

## 2016-09-16 MED ORDER — PREDNISONE 50 MG PO TABS
60.0000 mg | ORAL_TABLET | Freq: Once | ORAL | Status: AC
  Administered 2016-09-16: 60 mg via ORAL
  Filled 2016-09-16: qty 1

## 2016-09-16 NOTE — ED Triage Notes (Signed)
Pt c/o left wrist pain for a couple of days and denies any new injury.

## 2016-09-16 NOTE — ED Provider Notes (Signed)
[facility]-EMERGENCY DEPT Provider Note   CSN: [id number] Arrival date & time: 09/16/16  [id number]     History   Chief Complaint Chief Complaint  Patient presents with  . Wrist Pain    HPI Bobby Carroll is a 56 y.o. male.  Patient presents to the emergency department for evaluation of left wrist pain. He reports that he has had this pain before. He states that usually only last for 2448 hrs. but this has been going on for several days. He denies any direct injury. He has been told that this is tendinitis in the past. Patient is experiencing constant pain in the wrist that worsens with any movement.      Past Medical History:  Diagnosis Date  . Bursitis     There are no active problems to display for this patient.   Past Surgical History:  Procedure Laterality Date  . COLONOSCOPY    . CYST EXCISION         Home Medications    Prior to Admission medications   Medication Sig Start Date End Date Taking? Authorizing Provider  naproxen sodium (ANAPROX) 220 MG tablet Take 660 mg by mouth 2 (two) times daily as needed (pain).    Yes [provider]  ibuprofen (ADVIL,MOTRIN) 800 MG tablet Take 1 tablet (800 mg total) by mouth 3 (three) times daily. 09/16/16   Gilda CreasePollina, Leanthony Rhett J, MD  predniSONE (DELTASONE) 20 MG tablet Take 3 tablets (60 mg total) by mouth daily with breakfast. Start on 09/17/2016 09/16/16   Gilda CreasePollina, Jackey Housey J, MD  traMADol (ULTRAM) 50 MG tablet Take 1 tablet (50 mg total) by mouth every 6 (six) hours as needed. 09/16/16   Gilda CreasePollina, Blaike Vickers J, MD    Family History Family History  Problem Relation Age of Onset  . Hypertension Mother   . Hypertension Father     Social History Social History  Substance Use Topics  . Smoking status: Former Games developermoker  . Smokeless tobacco: Never Used  . Alcohol use Yes     Comment: occasional     Allergies   Penicillins   Review of Systems Review of Systems  Musculoskeletal: Positive for arthralgias.  All  other systems reviewed and are negative.    Physical Exam Updated Vital Signs BP (!) 153/94   Pulse 63   Temp 98.1 F (36.7 C)   Resp 18   Ht 6' (1.829 m)   Wt 95.3 kg (210 lb)   SpO2 100%   BMI 28.48 kg/m   Physical Exam  Constitutional: He is oriented to person, place, and time. He appears well-developed and well-nourished. No distress.  HENT:  Head: Normocephalic and atraumatic.  Right Ear: Hearing normal.  Left Ear: Hearing normal.  Nose: Nose normal.  Mouth/Throat: Oropharynx is clear and moist and mucous membranes are normal.  Eyes: Conjunctivae and EOM are normal. Pupils are equal, round, and reactive to light.  Neck: Normal range of motion. Neck supple.  Cardiovascular: Regular rhythm, S1 normal and S2 normal.  Exam reveals no gallop and no friction rub.   No murmur heard. Pulmonary/Chest: Effort normal and breath sounds normal. No respiratory distress. He exhibits no tenderness.  Abdominal: Soft. Normal appearance and bowel sounds are normal. There is no hepatosplenomegaly. There is no tenderness. There is no rebound, no guarding, no tenderness at McBurney's point and negative Murphy's sign. No hernia.  Musculoskeletal:       Left wrist: He exhibits decreased range of motion (Painful inhibition) and tenderness. He exhibits no  deformity.  Neurological: He is alert and oriented to person, place, and time. He has normal strength. No cranial nerve deficit or sensory deficit. Coordination normal. GCS eye subscore is 4. GCS verbal subscore is 5. GCS motor subscore is 6.  Skin: Skin is warm, dry and intact. No rash noted. No cyanosis.  Psychiatric: He has a normal mood and affect. His speech is normal and behavior is normal. Thought content normal.  Nursing note and vitals reviewed.    ED Treatments / Results  Labs (all labs ordered are listed, but only abnormal results are displayed) Labs Reviewed - No data to display  EKG  EKG Interpretation None        Radiology No results found.  Procedures Procedures (including critical care time)  Medications Ordered in ED Medications  predniSONE (DELTASONE) tablet 60 mg (not administered)  ibuprofen (ADVIL,MOTRIN) tablet 800 mg (not administered)     Initial Impression / Assessment and Plan / ED Course  I have reviewed the triage vital signs and the nursing notes.  Pertinent labs & imaging results that were available during my care of the patient were reviewed by me and considered in my medical decision making (see chart for details).     Presents with recurrent left wrist pain. He has had this on and off over the years. There has not been any injury, no need for x-ray. There is very slight swelling noted but no erythema, warmth. No concern for septic joint. 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 Cannot rule out gout, but suspect that this is repetitive use related, possibly a tendinitis. Will place in splint, treat with analgesia and short course of prednisone.  Final Clinical Impressions(s) / ED Diagnoses   Final diagnoses:  Acute pain of left wrist    New Prescriptions New Prescriptions   IBUPROFEN (ADVIL,MOTRIN) 800 MG TABLET    Take 1 tablet (800 mg total) by mouth 3 (three) times daily.   PREDNISONE (DELTASONE) 20 MG TABLET    Take 3 tablets (60 mg total) by mouth daily with breakfast. Start on 09/17/2016   TRAMADOL (ULTRAM) 50 MG TABLET    Take 1 tablet (50 mg total) by mouth every 6 (six) hours as needed.     Gilda Crease, MD 09/16/16 [phone]
